# Patient Record
Sex: Female | Born: 1985 | Race: White | Hispanic: No | Marital: Married | Smoking: Never smoker
Health system: Southern US, Community
[De-identification: ages and names within clinical notes are randomized; demographics above are authoritative.]

## PROBLEM LIST (undated history)

## (undated) DIAGNOSIS — Z789 Other specified health status: Secondary | ICD-10-CM

## (undated) DIAGNOSIS — R519 Headache, unspecified: Secondary | ICD-10-CM

## (undated) DIAGNOSIS — R51 Headache: Secondary | ICD-10-CM

## (undated) DIAGNOSIS — Z8742 Personal history of other diseases of the female genital tract: Secondary | ICD-10-CM

## (undated) HISTORY — PX: UMBILICAL HERNIA REPAIR: SHX196

## (undated) HISTORY — DX: Headache, unspecified: R51.9

## (undated) HISTORY — DX: Personal history of other diseases of the female genital tract: Z87.42

## (undated) HISTORY — DX: Headache: R51

---

## 2010-02-22 ENCOUNTER — Encounter: Payer: Self-pay | Admitting: Oncology

## 2010-03-22 ENCOUNTER — Encounter: Payer: Self-pay | Admitting: Genetic Counselor

## 2010-03-29 ENCOUNTER — Encounter: Payer: BC Managed Care – PPO | Admitting: Genetic Counselor

## 2010-04-26 ENCOUNTER — Other Ambulatory Visit: Payer: Self-pay | Admitting: Oncology

## 2010-04-26 ENCOUNTER — Encounter (HOSPITAL_BASED_OUTPATIENT_CLINIC_OR_DEPARTMENT_OTHER): Payer: BC Managed Care – PPO | Admitting: Oncology

## 2010-04-26 DIAGNOSIS — Z1239 Encounter for other screening for malignant neoplasm of breast: Secondary | ICD-10-CM

## 2010-04-26 DIAGNOSIS — Z803 Family history of malignant neoplasm of breast: Secondary | ICD-10-CM

## 2010-04-26 DIAGNOSIS — Z79899 Other long term (current) drug therapy: Secondary | ICD-10-CM

## 2010-05-16 ENCOUNTER — Ambulatory Visit
Admission: RE | Admit: 2010-05-16 | Discharge: 2010-05-16 | Disposition: A | Discharge status: Home / Self Care | Payer: BC Managed Care – PPO | Source: Ambulatory Visit | Attending: Oncology | Admitting: Oncology

## 2010-05-16 DIAGNOSIS — Z803 Family history of malignant neoplasm of breast: Secondary | ICD-10-CM

## 2010-05-17 ENCOUNTER — Other Ambulatory Visit (HOSPITAL_COMMUNITY): Payer: BC Managed Care – PPO

## 2010-12-23 ENCOUNTER — Ambulatory Visit: Payer: BC Managed Care – PPO | Admitting: Oncology

## 2011-05-02 ENCOUNTER — Other Ambulatory Visit: Payer: Self-pay | Admitting: Obstetrics and Gynecology

## 2011-05-02 DIAGNOSIS — Z1231 Encounter for screening mammogram for malignant neoplasm of breast: Secondary | ICD-10-CM

## 2011-05-23 ENCOUNTER — Ambulatory Visit
Admission: RE | Admit: 2011-05-23 | Discharge: 2011-05-23 | Disposition: A | Discharge status: Home / Self Care | Payer: BC Managed Care – PPO | Source: Ambulatory Visit | Attending: Obstetrics and Gynecology | Admitting: Obstetrics and Gynecology

## 2011-05-23 DIAGNOSIS — Z1231 Encounter for screening mammogram for malignant neoplasm of breast: Secondary | ICD-10-CM

## 2012-06-02 ENCOUNTER — Other Ambulatory Visit: Payer: Self-pay

## 2012-06-02 DIAGNOSIS — Z1231 Encounter for screening mammogram for malignant neoplasm of breast: Secondary | ICD-10-CM

## 2012-06-24 ENCOUNTER — Ambulatory Visit
Admission: RE | Admit: 2012-06-24 | Discharge: 2012-06-24 | Disposition: A | Discharge status: Home / Self Care | Payer: BC Managed Care – PPO | Source: Ambulatory Visit

## 2012-06-24 DIAGNOSIS — Z1231 Encounter for screening mammogram for malignant neoplasm of breast: Secondary | ICD-10-CM

## 2012-06-30 ENCOUNTER — Other Ambulatory Visit: Payer: Self-pay | Admitting: Obstetrics and Gynecology

## 2012-06-30 DIAGNOSIS — Z803 Family history of malignant neoplasm of breast: Secondary | ICD-10-CM

## 2012-07-30 ENCOUNTER — Other Ambulatory Visit: Payer: BC Managed Care – PPO

## 2013-01-06 NOTE — L&D Delivery Note (Signed)
Delivery Note  SVD viable female Apgars 9,9 over 2nd degree ML laceration.  Placenta delivered spontaneously intact with 3VC. Repair with 2-0 Chromic with good support and hemostasis noted and R/V exam confirms.  PH art was sent.  [place] cord blood was not done.  Mother and baby were doing well.  EBL 300cc  Candice Campavid Tylisa Alcivar, MD

## 2013-03-18 LAB — OB RESULTS CONSOLE ANTIBODY SCREEN: Antibody Screen: NEGATIVE

## 2013-03-18 LAB — OB RESULTS CONSOLE GC/CHLAMYDIA
CHLAMYDIA, DNA PROBE: NEGATIVE
Gonorrhea: NEGATIVE

## 2013-03-18 LAB — OB RESULTS CONSOLE ABO/RH: RH TYPE: POSITIVE

## 2013-03-18 LAB — OB RESULTS CONSOLE HEPATITIS B SURFACE ANTIGEN: HEP B S AG: NEGATIVE

## 2013-03-18 LAB — OB RESULTS CONSOLE RUBELLA ANTIBODY, IGM: Rubella: IMMUNE

## 2013-03-18 LAB — OB RESULTS CONSOLE RPR: RPR: NONREACTIVE

## 2013-03-18 LAB — OB RESULTS CONSOLE HIV ANTIBODY (ROUTINE TESTING): HIV: NONREACTIVE

## 2013-08-20 ENCOUNTER — Inpatient Hospital Stay (HOSPITAL_COMMUNITY)
Admission: AD | Admit: 2013-08-20 | Payer: BC Managed Care – PPO | Source: Ambulatory Visit | Admitting: Obstetrics and Gynecology

## 2013-09-23 LAB — OB RESULTS CONSOLE GBS: GBS: NEGATIVE

## 2013-10-27 ENCOUNTER — Inpatient Hospital Stay (HOSPITAL_COMMUNITY)
Admission: AD | Admit: 2013-10-27 | Discharge: 2013-10-29 | DRG: 775 | Disposition: A | Discharge status: Home / Self Care | Payer: BC Managed Care – PPO | Source: Ambulatory Visit | Attending: Obstetrics & Gynecology | Admitting: Obstetrics & Gynecology

## 2013-10-27 ENCOUNTER — Encounter (HOSPITAL_COMMUNITY): Payer: Self-pay | Admitting: *Deleted

## 2013-10-27 DIAGNOSIS — Z3490 Encounter for supervision of normal pregnancy, unspecified, unspecified trimester: Secondary | ICD-10-CM

## 2013-10-27 DIAGNOSIS — O471 False labor at or after 37 completed weeks of gestation: Secondary | ICD-10-CM | POA: Diagnosis present

## 2013-10-27 DIAGNOSIS — Z3A40 40 weeks gestation of pregnancy: Secondary | ICD-10-CM | POA: Diagnosis present

## 2013-10-27 HISTORY — DX: Other specified health status: Z78.9

## 2013-10-27 LAB — CBC
HEMATOCRIT: 35.1 % — AB (ref 36.0–46.0)
Hemoglobin: 12.1 g/dL (ref 12.0–15.0)
MCH: 31.2 pg (ref 26.0–34.0)
MCHC: 34.5 g/dL (ref 30.0–36.0)
MCV: 90.5 fL (ref 78.0–100.0)
Platelets: 159 10*3/uL (ref 150–400)
RBC: 3.88 MIL/uL (ref 3.87–5.11)
RDW: 13.1 % (ref 11.5–15.5)
WBC: 19.6 10*3/uL — ABNORMAL HIGH (ref 4.0–10.5)

## 2013-10-27 LAB — RPR

## 2013-10-27 MED ORDER — LACTATED RINGERS IV SOLN
INTRAVENOUS | Status: DC
  Administered 2013-10-27: 10:00:00 via INTRAVENOUS

## 2013-10-27 MED ORDER — OXYCODONE-ACETAMINOPHEN 5-325 MG PO TABS: 2.0000 | ORAL_TABLET | ORAL | Status: DC | PRN

## 2013-10-27 MED ORDER — OXYTOCIN 40 UNITS IN LACTATED RINGERS INFUSION - SIMPLE MED
62.5000 mL/h | INTRAVENOUS | Status: DC
  Administered 2013-10-27: 62.5 mL/h via INTRAVENOUS
  Filled 2013-10-27: qty 1000

## 2013-10-27 MED ORDER — EPHEDRINE 5 MG/ML INJ: 10.0000 mg | INTRAVENOUS | Status: DC | PRN

## 2013-10-27 MED ORDER — ACETAMINOPHEN 325 MG PO TABS: 650.0000 mg | ORAL_TABLET | ORAL | Status: DC | PRN

## 2013-10-27 MED ORDER — FENTANYL 2.5 MCG/ML BUPIVACAINE 1/10 % EPIDURAL INFUSION (WH - ANES): 14.0000 mL/h | INTRAMUSCULAR | Status: DC | PRN

## 2013-10-27 MED ORDER — PRENATAL MULTIVITAMIN CH
1.0000 | ORAL_TABLET | Freq: Every day | ORAL | Status: DC
  Administered 2013-10-28 – 2013-10-29 (×2): 1 via ORAL
  Filled 2013-10-27 (×2): qty 1

## 2013-10-27 MED ORDER — ONDANSETRON HCL 4 MG/2ML IJ SOLN: 4.0000 mg | INTRAMUSCULAR | Status: DC | PRN

## 2013-10-27 MED ORDER — OXYCODONE-ACETAMINOPHEN 5-325 MG PO TABS: 1.0000 | ORAL_TABLET | ORAL | Status: DC | PRN

## 2013-10-27 MED ORDER — MEDROXYPROGESTERONE ACETATE 150 MG/ML IM SUSP: 150.0000 mg | INTRAMUSCULAR | Status: DC | PRN

## 2013-10-27 MED ORDER — SIMETHICONE 80 MG PO CHEW
80.0000 mg | CHEWABLE_TABLET | ORAL | Status: DC | PRN
Start: 2013-10-27 — End: 2013-10-29

## 2013-10-27 MED ORDER — ONDANSETRON HCL 4 MG/2ML IJ SOLN: 4.0000 mg | Freq: Four times a day (QID) | INTRAMUSCULAR | Status: DC | PRN

## 2013-10-27 MED ORDER — PHENYLEPHRINE 40 MCG/ML (10ML) SYRINGE FOR IV PUSH (FOR BLOOD PRESSURE SUPPORT): 80.0000 ug | PREFILLED_SYRINGE | INTRAVENOUS | Status: DC | PRN

## 2013-10-27 MED ORDER — SENNOSIDES-DOCUSATE SODIUM 8.6-50 MG PO TABS
2.0000 | ORAL_TABLET | ORAL | Status: DC
  Administered 2013-10-27 – 2013-10-28 (×2): 2 via ORAL
  Filled 2013-10-27 (×2): qty 2

## 2013-10-27 MED ORDER — LANOLIN HYDROUS EX OINT: TOPICAL_OINTMENT | CUTANEOUS | Status: DC | PRN

## 2013-10-27 MED ORDER — LIDOCAINE HCL (PF) 1 % IJ SOLN
30.0000 mL | Freq: Once | INTRAMUSCULAR | Status: AC
  Administered 2013-10-27: 30 mL via INTRADERMAL

## 2013-10-27 MED ORDER — CITRIC ACID-SODIUM CITRATE 334-500 MG/5ML PO SOLN: 30.0000 mL | ORAL | Status: DC | PRN

## 2013-10-27 MED ORDER — DIPHENHYDRAMINE HCL 25 MG PO CAPS: 25.0000 mg | ORAL_CAPSULE | Freq: Four times a day (QID) | ORAL | Status: DC | PRN

## 2013-10-27 MED ORDER — OXYTOCIN BOLUS FROM INFUSION
500.0000 mL | INTRAVENOUS | Status: DC
  Administered 2013-10-27: 500 mL via INTRAVENOUS

## 2013-10-27 MED ORDER — LACTATED RINGERS IV SOLN: 500.0000 mL | Freq: Once | INTRAVENOUS | Status: DC

## 2013-10-27 MED ORDER — MEASLES, MUMPS & RUBELLA VAC ~~LOC~~ INJ
0.5000 mL | INJECTION | Freq: Once | SUBCUTANEOUS | Status: DC
  Filled 2013-10-27: qty 0.5

## 2013-10-27 MED ORDER — ONDANSETRON HCL 4 MG PO TABS: 4.0000 mg | ORAL_TABLET | ORAL | Status: DC | PRN

## 2013-10-27 MED ORDER — LACTATED RINGERS IV SOLN: 500.0000 mL | INTRAVENOUS | Status: DC | PRN

## 2013-10-27 MED ORDER — LIDOCAINE HCL (PF) 1 % IJ SOLN
30.0000 mL | INTRAMUSCULAR | Status: DC | PRN
Start: 2013-10-27 — End: 2013-10-27
  Filled 2013-10-27: qty 30

## 2013-10-27 MED ORDER — BENZOCAINE-MENTHOL 20-0.5 % EX AERO
1.0000 "application " | INHALATION_SPRAY | CUTANEOUS | Status: DC | PRN
  Administered 2013-10-27 – 2013-10-29 (×2): 1 via TOPICAL
  Filled 2013-10-27 (×3): qty 56

## 2013-10-27 MED ORDER — WITCH HAZEL-GLYCERIN EX PADS: 1.0000 "application " | MEDICATED_PAD | CUTANEOUS | Status: DC | PRN

## 2013-10-27 MED ORDER — DIPHENHYDRAMINE HCL 50 MG/ML IJ SOLN: 12.5000 mg | INTRAMUSCULAR | Status: DC | PRN

## 2013-10-27 MED ORDER — DIBUCAINE 1 % RE OINT
1.0000 "application " | TOPICAL_OINTMENT | RECTAL | Status: DC | PRN
  Filled 2013-10-27: qty 28

## 2013-10-27 MED ORDER — TETANUS-DIPHTH-ACELL PERTUSSIS 5-2.5-18.5 LF-MCG/0.5 IM SUSP: 0.5000 mL | Freq: Once | INTRAMUSCULAR | Status: DC

## 2013-10-27 MED ORDER — FLEET ENEMA 7-19 GM/118ML RE ENEM: 1.0000 | ENEMA | RECTAL | Status: DC | PRN

## 2013-10-27 MED ORDER — ZOLPIDEM TARTRATE 5 MG PO TABS: 5.0000 mg | ORAL_TABLET | Freq: Every evening | ORAL | Status: DC | PRN

## 2013-10-27 MED ORDER — IBUPROFEN 600 MG PO TABS
600.0000 mg | ORAL_TABLET | Freq: Four times a day (QID) | ORAL | Status: DC
  Administered 2013-10-27 – 2013-10-29 (×8): 600 mg via ORAL
  Filled 2013-10-27 (×9): qty 1

## 2013-10-27 NOTE — H&P (Signed)
Carmie KannerBrittany Houchen is a 28 y.o. female presenting for labor sxs.  Pregnancy uncomplcated.  GBS neg.Marland Kitchen. History OB History   Grav Para Term Preterm Abortions TAB SAB Ect Mult Living   1              Past Medical History  Diagnosis Date  . Medical history non-contributory    Past Surgical History  Procedure Laterality Date  . Umbilical hernia repair     Family History: family history is not on file. Social History:  reports that she has never smoked. She does not have any smokeless tobacco history on file. She reports that she does not drink alcohol or use illicit drugs.   Prenatal Transfer Tool  Maternal Diabetes: No Genetic Screening: Normal Maternal Ultrasounds/Referrals: Normal Fetal Ultrasounds or other Referrals:  None Maternal Substance Abuse:  No Significant Maternal Medications:  None Significant Maternal Lab Results:  None Other Comments:  None  ROS  Dilation: 7 Effacement (%): 90 Station: -2 Exam by:: L. Paschal, RN Blood pressure 124/67, pulse 82, temperature 98.4 F (36.9 C), temperature source Oral, resp. rate 18. Exam Physical Exam  Prenatal labs: ABO, Rh:   Antibody:   Rubella:   RPR:    HBsAg:    HIV:    GBS:     Assessment/Plan: IUP at term in active labor Anticipate SVD   Oron Westrup C 10/27/2013, 9:46 AM

## 2013-10-27 NOTE — Lactation Note (Signed)
This note was copied from the chart of Lori [place] Islands[place]ittany Hammes. Lactation Consultation Note  Patient Name: Lori Carmie KannerBrittany Roach Today's Date: 10/27/2013 Reason for consult: Initial assessment of this mom and baby, with husband at bedside, now 10 hours postpartum.  Mom is primipara and she has tried both cradle and football positions but [employer] assisted with across-the-lap position and baby latched well after several attempts and demonstrated rhythmical sucking bursts and swallows for >10 minutes.  FOB shown how to assist and stimulate baby, if sleepy.  LC encouraged cue feedings and frequent STS, vary positions if baby fussy in one position or if unable to latch.  Mom encouraged to feed baby 8-12 times/24 hours and with feeding cues. LC encouraged review of Baby and Me pp 9, 14 and 20-25 for STS and BF information. LC provided [employer] Resource brochure and reviewed [facility] services and list of community and web site resources.    Maternal Data Formula Feeding for Exclusion: No Has patient been taught Hand Expression?: Yes Does the patient have breastfeeding experience prior to this delivery?: No  Feeding Feeding Type: Breast Fed Length of feed: 10 min  LATCH Score/Interventions Latch: Grasps breast easily, tongue down, lips flanged, rhythmical sucking. (needed a few attempts but then sustained latch) Intervention(s): Adjust position;Assist with latch;Breast compression  Audible Swallowing: Spontaneous and intermittent Intervention(s): Hand expression;Alternate breast massage;Skin to skin  Type of Nipple: Everted at rest and after stimulation  Comfort (Breast/Nipple): Soft / non-tender     Hold (Positioning): Assistance needed to correctly position infant at breast and maintain latch. Intervention(s): Position options;Skin to skin;Support Pillows;Breastfeeding basics reviewed  LATCH Score: 9 (LC observed and assisted on right breast in cross-cradle position)  Lactation Tools Discussed/Used   STS,  cue feedings, hand expression  Consult Status Consult Status: Follow-up Date: 10/28/13 Follow-up type: In-patient    Warrick ParisianBryant, Calley Drenning [facility] 10/27/2013, 10:13 PM

## 2013-10-27 NOTE — MAU Note (Addendum)
Onset contractions 0230, good Fetal movement, plan NO epidural, open to this if needed, has been 4cm, induction scheduled next Thursday.GBS negative. Coping well with labor

## 2013-10-28 LAB — CBC
HCT: 32.9 % — ABNORMAL LOW (ref 36.0–46.0)
Hemoglobin: 11.2 g/dL — ABNORMAL LOW (ref 12.0–15.0)
MCH: 31.2 pg (ref 26.0–34.0)
MCHC: 34 g/dL (ref 30.0–36.0)
MCV: 91.6 fL (ref 78.0–100.0)
Platelets: 153 10*3/uL (ref 150–400)
RBC: 3.59 MIL/uL — ABNORMAL LOW (ref 3.87–5.11)
RDW: 13.3 % (ref 11.5–15.5)
WBC: 15 10*3/uL — ABNORMAL HIGH (ref 4.0–10.5)

## 2013-10-28 NOTE — Lactation Note (Signed)
This note was copied from the chart of Lori [place]. Lactation Consultation Note  Patient Name: Lori Carmie KannerBrittany Roach MVHQI'OToday's Date: 10/28/2013 Reason for consult: Follow-up assessment Baby 27 hours of life. Mom reports some nipple tenderness. Since baby just finished nursing and sound asleep, enc mom to call out for assistance with latching. Discussed need to maintain a deep latch. Mom states that she is able to hand express colostrum readily from both breast. Her nipples are round with no breakdown or abrasions from baby suckling. Enc mom to continue to feed with cues. Discussed cluster-feeding. Enc lots of STS.   Maternal Data    Feeding Feeding Type:  (Mom reports baby just finished nursing.) Length of feed: 30 min  LATCH Score/Interventions                      Lactation Tools Discussed/Used     Consult Status Consult Status: PRN    Geralynn OchsWILLIARD, Damilola Flamm 10/28/2013, 3:20 PM

## 2013-10-28 NOTE — Progress Notes (Signed)
Post Partum Day 1 Subjective: no complaints, up ad lib, voiding and tolerating PO.  Wants circ.  Objective: Blood pressure 107/50, pulse 69, temperature 98.5 F (36.9 C), temperature source Oral, resp. rate 18, height 5\' 3"  (1.6 m), weight 136 lb (61.689 kg), unknown if currently breastfeeding.  Physical Exam:  General: alert, cooperative and appears stated age Lochia: appropriate Uterine Fundus: firm Incision: healing well, no significant drainage DVT Evaluation: No evidence of DVT seen on physical exam. Negative Homan's sign. No cords or calf tenderness.   Recent Labs  10/27/13 0946 10/28/13 0605  HGB 12.1 11.2*  HCT 35.1* 32.9*    Assessment/Plan: Plan for discharge tomorrow and Circumcision prior to discharge Patient counseled for circ including risk of bleeding, infection, and scarring.  All questions were answered and we will proceed.     LOS: 1 day   Lori Roach 10/28/2013, 8:44 AM

## 2013-10-29 MED ORDER — IBUPROFEN 600 MG PO TABS: 600.0000 mg | ORAL_TABLET | Freq: Four times a day (QID) | ORAL | Status: DC

## 2013-10-29 MED ORDER — OXYCODONE-ACETAMINOPHEN 5-325 MG PO TABS: 1.0000 | ORAL_TABLET | ORAL | Status: DC | PRN

## 2013-10-29 NOTE — Discharge Summary (Signed)
Obstetric Discharge Summary Reason for Admission: onset of labor Prenatal Procedures: none Intrapartum Procedures: spontaneous vaginal delivery Postpartum Procedures: none Complications-Operative and Postpartum: none and 2nd degree perineal laceration Hemoglobin  Date Value Ref Range Status  10/28/2013 11.2* 12.0 - 15.0 g/dL Final     HCT  Date Value Ref Range Status  10/28/2013 32.9* 36.0 - 46.0 % Final    Physical Exam:  General: alert, cooperative and appears stated age Lochia: appropriate Uterine Fundus: firm Incision: healing well, no significant drainage, no dehiscence DVT Evaluation: No evidence of DVT seen on physical exam. Negative Homan's sign. No cords or calf tenderness.  Discharge Diagnoses: Term Pregnancy-delivered  Discharge Information: Date: 10/29/2013 Activity: pelvic rest Diet: routine Medications: PNV, Ibuprofen and Percocet Condition: stable Instructions: refer to practice specific booklet Discharge to: home   Newborn Data: Live born female  Birth Weight: 8 lb 0.9 oz (3655 g) APGAR: 9, 9  Home with mother.  Correy Weidner 10/29/2013, 9:23 AM

## 2013-10-29 NOTE — Progress Notes (Signed)
Post Partum Day 2 Subjective: no complaints, up ad lib, voiding and tolerating PO  Objective: Blood pressure 108/50, pulse 66, temperature 98 F (36.7 C), temperature source Oral, resp. rate 18, height 5\' 3"  (1.6 m), weight 136 lb (61.689 kg), SpO2 98.00%, unknown if currently breastfeeding.  Physical Exam:  General: alert, cooperative and appears stated age Lochia: appropriate Uterine Fundus: firm Incision: healing well, no significant drainage, no dehiscence DVT Evaluation: No evidence of DVT seen on physical exam. Negative Homan's sign. No cords or calf tenderness.   Recent Labs  10/27/13 0946 10/28/13 0605  HGB 12.1 11.2*  HCT 35.1* 32.9*    Assessment/Plan: Discharge home, Breastfeeding and Circumcision prior to discharge   LOS: 2 days   Lori Roach 10/29/2013, 9:19 AM

## 2013-10-29 NOTE — Discharge Instructions (Signed)
Call MD for T>100.4, heavy vaginal bleeding, or respiratory distress.  Call office to schedule postpartum visit in 4-6 weeks.  No driving while taking narcotics.  Pelvic rest x 6 weeks.

## 2013-11-03 ENCOUNTER — Inpatient Hospital Stay (HOSPITAL_COMMUNITY): Admission: RE | Admit: 2013-11-03 | Payer: BC Managed Care – PPO | Source: Ambulatory Visit

## 2013-11-07 ENCOUNTER — Encounter (HOSPITAL_COMMUNITY): Payer: Self-pay | Admitting: *Deleted

## 2016-01-07 NOTE — L&D Delivery Note (Signed)
Delivery Note At 4:23 AM a viable female was delivered via Vaginal, Spontaneous  APGAR:9 9  Weight pending  .   Placenta status:sponanteously with 3 vessel cord , .  Cord:  with the following complications:none.  Cord pH: not obtained  Anesthesia:  none Episiotomy: None Lacerations: 1st degree Suture Repair: 3.0 chromic Est. Blood Loss (mL): 300  Mom to postpartum.  Baby to Couplet care / Skin to Skin.  Robecca Fulgham L 12/17/2016, 4:39 AM

## 2016-05-05 LAB — OB RESULTS CONSOLE HEPATITIS B SURFACE ANTIGEN: HEP B S AG: NEGATIVE

## 2016-05-05 LAB — OB RESULTS CONSOLE RUBELLA ANTIBODY, IGM: Rubella: IMMUNE

## 2016-05-05 LAB — OB RESULTS CONSOLE ABO/RH: RH Type: POSITIVE

## 2016-05-05 LAB — OB RESULTS CONSOLE HIV ANTIBODY (ROUTINE TESTING): HIV: NONREACTIVE

## 2016-05-05 LAB — OB RESULTS CONSOLE GC/CHLAMYDIA
Chlamydia: NEGATIVE
Gonorrhea: NEGATIVE

## 2016-05-05 LAB — OB RESULTS CONSOLE ANTIBODY SCREEN: ANTIBODY SCREEN: NEGATIVE

## 2016-05-05 LAB — OB RESULTS CONSOLE RPR: RPR: NONREACTIVE

## 2016-12-12 ENCOUNTER — Encounter (HOSPITAL_COMMUNITY): Payer: Self-pay | Admitting: *Deleted

## 2016-12-12 ENCOUNTER — Telehealth (HOSPITAL_COMMUNITY): Payer: Self-pay | Admitting: *Deleted

## 2016-12-12 LAB — OB RESULTS CONSOLE GBS: GBS: NEGATIVE

## 2016-12-12 NOTE — Telephone Encounter (Signed)
Preadmission screen  

## 2016-12-16 ENCOUNTER — Inpatient Hospital Stay (HOSPITAL_COMMUNITY): Payer: BLUE CROSS/BLUE SHIELD

## 2016-12-17 ENCOUNTER — Encounter (HOSPITAL_COMMUNITY): Payer: Self-pay

## 2016-12-17 ENCOUNTER — Inpatient Hospital Stay (HOSPITAL_COMMUNITY)
Admission: AD | Admit: 2016-12-17 | Discharge: 2016-12-18 | DRG: 807 | Disposition: A | Discharge status: Home / Self Care | Payer: BLUE CROSS/BLUE SHIELD | Source: Ambulatory Visit | Attending: Obstetrics and Gynecology | Admitting: Obstetrics and Gynecology

## 2016-12-17 DIAGNOSIS — Z3A40 40 weeks gestation of pregnancy: Secondary | ICD-10-CM | POA: Diagnosis not present

## 2016-12-17 DIAGNOSIS — Z3483 Encounter for supervision of other normal pregnancy, third trimester: Secondary | ICD-10-CM | POA: Diagnosis present

## 2016-12-17 LAB — CBC
HEMATOCRIT: 38.1 % (ref 36.0–46.0)
Hemoglobin: 12.8 g/dL (ref 12.0–15.0)
MCH: 31 pg (ref 26.0–34.0)
MCHC: 33.6 g/dL (ref 30.0–36.0)
MCV: 92.3 fL (ref 78.0–100.0)
PLATELETS: 158 10*3/uL (ref 150–400)
RBC: 4.13 MIL/uL (ref 3.87–5.11)
RDW: 12.8 % (ref 11.5–15.5)
WBC: 12.9 10*3/uL — AB (ref 4.0–10.5)

## 2016-12-17 LAB — TYPE AND SCREEN
ABO/RH(D): A POS
Antibody Screen: NEGATIVE

## 2016-12-17 LAB — RPR: RPR Ser Ql: NONREACTIVE

## 2016-12-17 LAB — ABO/RH: ABO/RH(D): A POS

## 2016-12-17 MED ORDER — DIBUCAINE 1 % RE OINT: 1.0000 "application " | TOPICAL_OINTMENT | RECTAL | Status: DC | PRN

## 2016-12-17 MED ORDER — TETANUS-DIPHTH-ACELL PERTUSSIS 5-2.5-18.5 LF-MCG/0.5 IM SUSP: 0.5000 mL | Freq: Once | INTRAMUSCULAR | Status: DC

## 2016-12-17 MED ORDER — DIPHENHYDRAMINE HCL 25 MG PO CAPS: 25.0000 mg | ORAL_CAPSULE | Freq: Four times a day (QID) | ORAL | Status: DC | PRN

## 2016-12-17 MED ORDER — ONDANSETRON HCL 4 MG/2ML IJ SOLN: 4.0000 mg | Freq: Four times a day (QID) | INTRAMUSCULAR | Status: DC | PRN

## 2016-12-17 MED ORDER — FLEET ENEMA 7-19 GM/118ML RE ENEM: 1.0000 | ENEMA | RECTAL | Status: DC | PRN

## 2016-12-17 MED ORDER — MEDROXYPROGESTERONE ACETATE 150 MG/ML IM SUSP: 150.0000 mg | INTRAMUSCULAR | Status: DC | PRN

## 2016-12-17 MED ORDER — ZOLPIDEM TARTRATE 5 MG PO TABS: 5.0000 mg | ORAL_TABLET | Freq: Every evening | ORAL | Status: DC | PRN

## 2016-12-17 MED ORDER — BISACODYL 10 MG RE SUPP: 10.0000 mg | Freq: Every day | RECTAL | Status: DC | PRN

## 2016-12-17 MED ORDER — ACETAMINOPHEN 325 MG PO TABS: 650.0000 mg | ORAL_TABLET | ORAL | Status: DC | PRN

## 2016-12-17 MED ORDER — OXYCODONE-ACETAMINOPHEN 5-325 MG PO TABS: 1.0000 | ORAL_TABLET | ORAL | Status: DC | PRN

## 2016-12-17 MED ORDER — IBUPROFEN 600 MG PO TABS
600.0000 mg | ORAL_TABLET | Freq: Four times a day (QID) | ORAL | Status: DC
  Administered 2016-12-17 – 2016-12-18 (×6): 600 mg via ORAL
  Filled 2016-12-17 (×6): qty 1

## 2016-12-17 MED ORDER — LACTATED RINGERS IV SOLN
INTRAVENOUS | Status: DC
  Administered 2016-12-17: 04:00:00 via INTRAVENOUS

## 2016-12-17 MED ORDER — ONDANSETRON HCL 4 MG/2ML IJ SOLN: 4.0000 mg | INTRAMUSCULAR | Status: DC | PRN

## 2016-12-17 MED ORDER — OXYCODONE-ACETAMINOPHEN 5-325 MG PO TABS: 2.0000 | ORAL_TABLET | ORAL | Status: DC | PRN

## 2016-12-17 MED ORDER — FLEET ENEMA 7-19 GM/118ML RE ENEM: 1.0000 | ENEMA | Freq: Every day | RECTAL | Status: DC | PRN

## 2016-12-17 MED ORDER — OXYTOCIN 40 UNITS IN LACTATED RINGERS INFUSION - SIMPLE MED
INTRAVENOUS | Status: AC
  Filled 2016-12-17: qty 1000

## 2016-12-17 MED ORDER — WITCH HAZEL-GLYCERIN EX PADS: 1.0000 "application " | MEDICATED_PAD | CUTANEOUS | Status: DC | PRN

## 2016-12-17 MED ORDER — SENNOSIDES-DOCUSATE SODIUM 8.6-50 MG PO TABS
2.0000 | ORAL_TABLET | ORAL | Status: DC
  Administered 2016-12-17: 2 via ORAL
  Filled 2016-12-17: qty 2

## 2016-12-17 MED ORDER — BENZOCAINE-MENTHOL 20-0.5 % EX AERO
1.0000 "application " | INHALATION_SPRAY | CUTANEOUS | Status: DC | PRN
  Administered 2016-12-17: 1 via TOPICAL
  Filled 2016-12-17: qty 56

## 2016-12-17 MED ORDER — SOD CITRATE-CITRIC ACID 500-334 MG/5ML PO SOLN: 30.0000 mL | ORAL | Status: DC | PRN

## 2016-12-17 MED ORDER — ONDANSETRON HCL 4 MG PO TABS: 4.0000 mg | ORAL_TABLET | ORAL | Status: DC | PRN

## 2016-12-17 MED ORDER — MEASLES, MUMPS & RUBELLA VAC ~~LOC~~ INJ
0.5000 mL | INJECTION | Freq: Once | SUBCUTANEOUS | Status: DC
  Filled 2016-12-17: qty 0.5

## 2016-12-17 MED ORDER — PRENATAL MULTIVITAMIN CH
1.0000 | ORAL_TABLET | Freq: Every day | ORAL | Status: DC
  Administered 2016-12-17 – 2016-12-18 (×2): 1 via ORAL
  Filled 2016-12-17 (×2): qty 1

## 2016-12-17 MED ORDER — COCONUT OIL OIL: 1.0000 "application " | TOPICAL_OIL | Status: DC | PRN

## 2016-12-17 MED ORDER — LIDOCAINE HCL (PF) 1 % IJ SOLN
INTRAMUSCULAR | Status: AC
  Filled 2016-12-17: qty 30

## 2016-12-17 MED ORDER — LIDOCAINE HCL (PF) 1 % IJ SOLN
30.0000 mL | INTRAMUSCULAR | Status: DC | PRN
  Administered 2016-12-17: 30 mL via SUBCUTANEOUS
  Filled 2016-12-17: qty 30

## 2016-12-17 MED ORDER — OXYTOCIN 40 UNITS IN LACTATED RINGERS INFUSION - SIMPLE MED
2.5000 [IU]/h | INTRAVENOUS | Status: DC
  Administered 2016-12-17: 2.5 [IU]/h via INTRAVENOUS

## 2016-12-17 MED ORDER — OXYTOCIN BOLUS FROM INFUSION
500.0000 mL | Freq: Once | INTRAVENOUS | Status: AC
  Administered 2016-12-17: 500 mL via INTRAVENOUS

## 2016-12-17 MED ORDER — LACTATED RINGERS IV SOLN: 500.0000 mL | INTRAVENOUS | Status: DC | PRN

## 2016-12-17 MED ORDER — SIMETHICONE 80 MG PO CHEW: 80.0000 mg | CHEWABLE_TABLET | ORAL | Status: DC | PRN

## 2016-12-17 NOTE — MAU Note (Signed)
Pt here with contractions; denies bleeding or leaking.

## 2016-12-17 NOTE — Progress Notes (Addendum)
Post Partum Day 0 Subjective: no complaints, up ad lib, voiding, tolerating PO and + flatus  Objective: Blood pressure (!) 104/58, pulse 68, temperature 98.7 F (37.1 C), temperature source Oral, resp. rate 16, height 5\' 3"  (1.6 m), weight 61.2 kg (135 lb), last menstrual period 03/09/2016, unknown if currently breastfeeding.  Physical Exam:  General: alert, cooperative and appears stated age Lochia: appropriate Uterine Fundus: firm Incision: N/A DVT Evaluation: No evidence of DVT seen on physical exam. Negative Homan's sign. No cords or calf tenderness. No significant calf/ankle edema.  Recent Labs    12/17/16 0406  HGB 12.8  HCT 38.1    Assessment/Plan: Plan for discharge tomorrow, Breastfeeding and Circumcision prior to discharge. Risks discussed including infection, bleeding, damage to penis and poor cosmesis.    LOS: 0 days   Lori Roach 12/17/2016, 8:13 AM

## 2016-12-17 NOTE — Progress Notes (Signed)
Patient ID: Lori KannerBrittany Roach, female   DOB: 1985-08-22, 31 y.o.   MRN: [id number]  Called to standby for delivery of pt who arrived to MAU at 9cm. Upon my arrival the pt was bearing down involuntarily. She had SROM of clear fluid and then began crowning shortly afterwards. She progressed to SVD @ 0423 of VMI, Apgars 8/9, Wt 8+8.9. Infant dried and placed on pt's abd. Dr Vincente PoliGrewal just subsequent to delivery.  Cam HaiSHAW, KIMBERLY 12/17/2016

## 2016-12-17 NOTE — Progress Notes (Signed)
Orders received for admission.

## 2016-12-17 NOTE — H&P (Signed)
Lori Roach is a 31 y.o. G 2 P 1 at 40 w 3 days presents complete and BBOW. OB History    Gravida Para Term Preterm AB Living   2 1 1  0 0 1   SAB TAB Ectopic Multiple Live Births   0 0 0 0 1     Past Medical History:  Diagnosis Date  . Headache   . History of PCOS   . Medical history non-contributory    Past Surgical History:  Procedure Laterality Date  . UMBILICAL HERNIA REPAIR     Family History: family history includes Breast cancer in her maternal grandmother and mother; Heart disease in her maternal grandfather; Hypertension in her paternal aunt and paternal grandmother. Social History:  reports that  has never smoked. she has never used smokeless tobacco. She reports that she does not drink alcohol or use drugs.     Maternal Diabetes: No Genetic Screening: Normal Maternal Ultrasounds/Referrals: Normal Fetal Ultrasounds or other Referrals:  None Maternal Substance Abuse:  No Significant Maternal Medications:  None Significant Maternal Lab Results:  None Other Comments:  None  Review of Systems  All other systems reviewed and are negative.  Maternal Medical History:  Reason for admission: Contractions.   Prenatal complications: no prenatal complications   Dilation: Lip/rim Effacement (%): 90 Station: 0 Exam by:: Charlene BrookeErin Davis, RNC  Blood pressure 111/64, pulse 90, temperature 98.2 F (36.8 C), temperature source Oral, resp. rate 18, height 5\' 3"  (1.6 m), weight 61.2 kg (135 lb), last menstrual period 03/09/2016, unknown if currently breastfeeding. Maternal Exam:  Abdomen: Fetal presentation: vertex     Fetal Exam Fetal State Assessment: Category I - tracings are normal.     Physical Exam  Nursing note and vitals reviewed. Constitutional: She appears well-developed.  HENT:  Head: Normocephalic.  Eyes: Pupils are equal, round, and reactive to light.  Neck: Normal range of motion.  Cardiovascular: Normal rate and regular rhythm.  Respiratory:  Effort normal.  GI: Soft.    Prenatal labs: ABO, Rh: A/Positive/-- (04/30 0000) Antibody: Negative (04/30 0000) Rubella: Immune (04/30 0000) RPR: Nonreactive (04/30 0000)  HBsAg: Negative (04/30 0000)  HIV: Non-reactive (04/30 0000)  GBS: Negative (12/07 0000)   Assessment/Plan: IUP at 40 w 3 days Labor NSVD  [name] 12/17/2016, 4:35 AM

## 2016-12-17 NOTE — Lactation Note (Signed)
This note was copied from a baby's chart. Lactation Consultation Note  Patient Name: Boy Carmie KannerBrittany Mercado JXBJY'NToday's Date: 12/17/2016 Reason for consult: Initial assessment   P2, Baby 10 hours old. Mother breastfed first child for 13 months without any problems.  Hx of PCOS. Mother denies questions or problems.  She states she has hand expressed drops. During consult, visitors entered room. Mom made aware of O/P services, breastfeeding support groups, community resources, and our phone # for post-discharge questions.  Suggest calling if mother needs further assistance.   Maternal Data Has patient been taught Hand Expression?: Yes Does the patient have breastfeeding experience prior to this delivery?: Yes  Feeding Feeding Type: Breast Fed Length of feed: 25 min  LATCH Score                   Interventions    Lactation Tools Discussed/Used     Consult Status Consult Status: Follow-up Date: 12/18/16 Follow-up type: In-patient    Dahlia ByesBerkelhammer, Ruth [facility] 12/17/2016, 2:47 PM

## 2016-12-18 LAB — BIRTH TISSUE RECOVERY COLLECTION (PLACENTA DONATION)

## 2016-12-18 MED ORDER — IBUPROFEN 600 MG PO TABS: 600.0000 mg | ORAL_TABLET | Freq: Four times a day (QID) | ORAL | 0 refills | Status: AC

## 2016-12-18 NOTE — Discharge Summary (Signed)
Obstetric Discharge Summary Reason for Admission: onset of labor Prenatal Procedures: ultrasound Intrapartum Procedures: spontaneous vaginal delivery Postpartum Procedures: none Complications-Operative and Postpartum: none Hemoglobin  Date Value Ref Range Status  12/17/2016 12.8 12.0 - 15.0 g/dL Final   HCT  Date Value Ref Range Status  12/17/2016 38.1 36.0 - 46.0 % Final    Physical Exam:  General: alert and cooperative Lochia: appropriate Uterine Fundus: firm Incision: n/a DVT Evaluation: No evidence of DVT seen on physical exam.  Discharge Diagnoses: Term Pregnancy-delivered  Discharge Information: Date: 12/18/2016 Activity: pelvic rest Diet: routine Medications: PNV and Ibuprofen Condition: stable Instructions: refer to practice specific booklet Discharge to: home Follow-up Information    [place] Schedule an appointment as soon as possible for a visit in 6 week(s).   Contact information: [address] [place] [address] [phone]           Newborn Data: Live born female  Birth Weight: 8 lb 8.9 oz (3881 g) APGAR: 8, 9  Newborn Delivery   Birth date/time:  12/17/2016 04:23:00 Delivery type:  Vaginal, Spontaneous     Home with mother.  Lori Roach 12/18/2016, 8:27 AM

## 2016-12-18 NOTE — Lactation Note (Signed)
This note was copied from a baby's chart. Lactation Consultation Note  Patient Name: Lori Roach GNFAO'ZToday's Date: 12/18/2016 Reason for consult: Follow-up assessment   P2, Ex BF.  Reviewed tips with mother regarding achieving a deeper latch. Encouraged STS, tummy time when awake and chin tug. Reviewed waking techniques. Mom encouraged to feed baby 8-12 times/24 hours and with feeding cues.  Reviewed engorgement care and monitoring voids/stools.    Maternal Data    Feeding Feeding Type: Breast Fed Length of feed: 15 min  LATCH Score Latch: Grasps breast easily, tongue down, lips flanged, rhythmical sucking.  Audible Swallowing: A few with stimulation  Type of Nipple: Everted at rest and after stimulation  Comfort (Breast/Nipple): Soft / non-tender  Hold (Positioning): No assistance needed to correctly position infant at breast.  LATCH Score: 9  Interventions    Lactation Tools Discussed/Used     Consult Status Consult Status: Complete    Hardie PulleyBerkelhammer, Ruth Boschen 12/18/2016, 9:44 AM

## 2016-12-19 ENCOUNTER — Inpatient Hospital Stay (HOSPITAL_COMMUNITY): Payer: BLUE CROSS/BLUE SHIELD

## 2018-06-03 ENCOUNTER — Other Ambulatory Visit: Payer: Self-pay | Admitting: Obstetrics and Gynecology

## 2018-06-03 DIAGNOSIS — Z803 Family history of malignant neoplasm of breast: Secondary | ICD-10-CM

## 2018-06-03 DIAGNOSIS — Z1231 Encounter for screening mammogram for malignant neoplasm of breast: Secondary | ICD-10-CM

## 2018-07-21 ENCOUNTER — Ambulatory Visit
Admission: RE | Admit: 2018-07-21 | Discharge: 2018-07-21 | Disposition: A | Discharge status: Home / Self Care | Payer: BC Managed Care – PPO | Source: Ambulatory Visit | Attending: Obstetrics and Gynecology | Admitting: Obstetrics and Gynecology

## 2018-07-21 ENCOUNTER — Other Ambulatory Visit: Payer: Self-pay

## 2018-07-21 DIAGNOSIS — Z1231 Encounter for screening mammogram for malignant neoplasm of breast: Secondary | ICD-10-CM

## 2018-08-19 ENCOUNTER — Ambulatory Visit
Admission: RE | Admit: 2018-08-19 | Discharge: 2018-08-19 | Disposition: A | Discharge status: Home / Self Care | Payer: BC Managed Care – PPO | Source: Ambulatory Visit | Attending: Obstetrics and Gynecology | Admitting: Obstetrics and Gynecology

## 2018-08-19 ENCOUNTER — Other Ambulatory Visit: Payer: Self-pay

## 2018-08-19 DIAGNOSIS — Z803 Family history of malignant neoplasm of breast: Secondary | ICD-10-CM

## 2018-08-19 MED ORDER — GADOBUTROL 1 MMOL/ML IV SOLN
6.0000 mL | Freq: Once | INTRAVENOUS | Status: AC | PRN
  Administered 2018-08-19: 09:00:00 6 mL via INTRAVENOUS

## 2019-08-25 ENCOUNTER — Encounter: Payer: Self-pay | Admitting: Genetic Counselor

## 2020-02-22 IMAGING — MR MR BILATERAL BREAST WITHOUT AND WITH CONTRAST
8 of 12 series · 33 of 48 positions shown · IV contrast (gadavist)
Comparison: No previous breast MRI.

CLINICAL DATA: Family history of breast cancer including mother at
age 32. Extremely dense breast.

LABS:  Not applicable
EXAM:
BILATERAL BREAST MRI WITH AND WITHOUT CONTRAST
TECHNIQUE: Multiplanar, multisequence MR images of both breasts were obtained
prior to and following the intravenous administration of 6 ml of
Gadavist

[Series 2: t2_tirm_tra ipat (a-p) · axial · 3.0mm · 0.62mm/px · 1 of 50 slices shown]
[im 1/50]
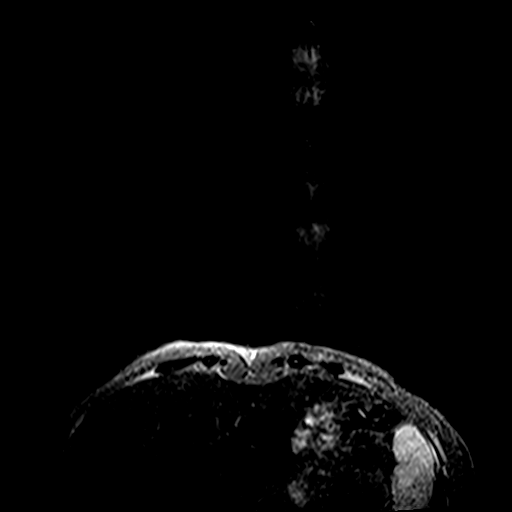

[Series 3: fl3d pre-cm no · axial · non-contrast · 1.2mm · 0.81mm/px · z∈[-67,+104]mm · 5 of 144 slices shown]
[im 1/144]
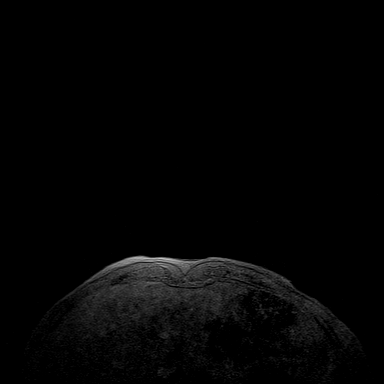
[im 36/144]
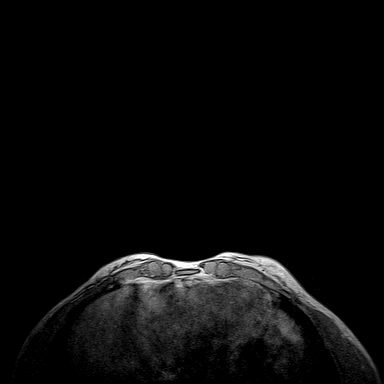
[im 72/144]
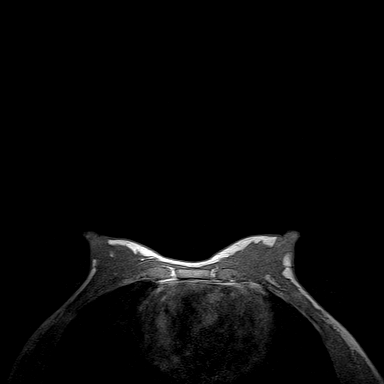
[im 108/144]
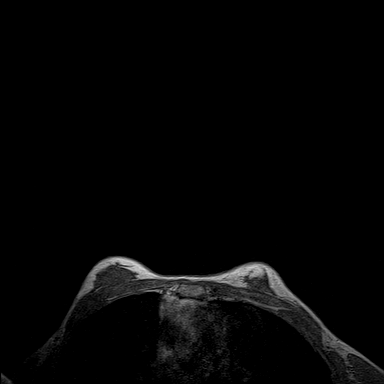
[im 144/144]
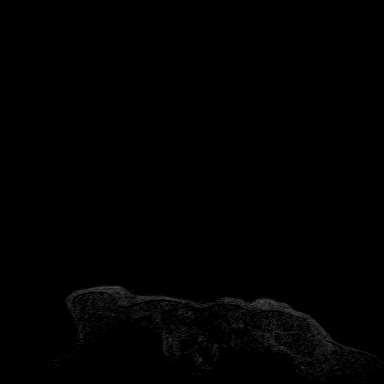

[Series 4: fl3d pre-cm · axial · non-contrast · 1.2mm · 0.83mm/px · z∈[-67,+105]mm · 5 of 144 slices shown]
[im 1/144]
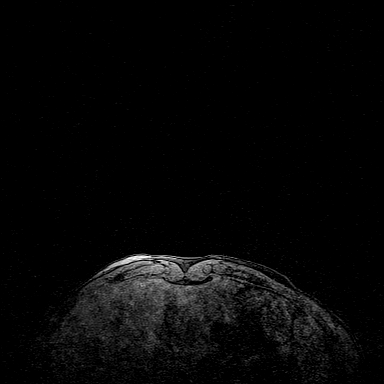
[im 36/144]
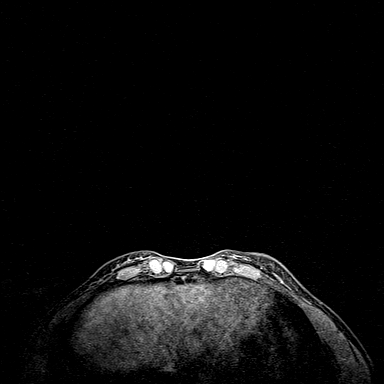
[im 72/144]
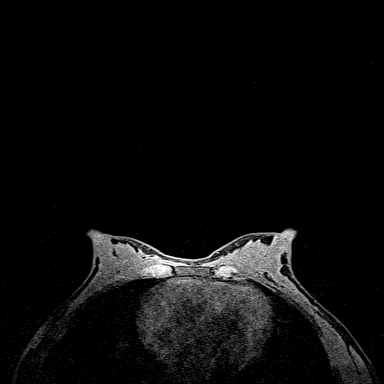
[im 108/144]
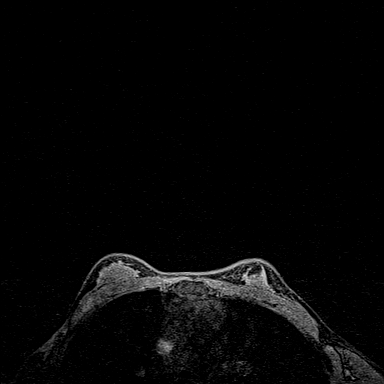
[im 144/144]
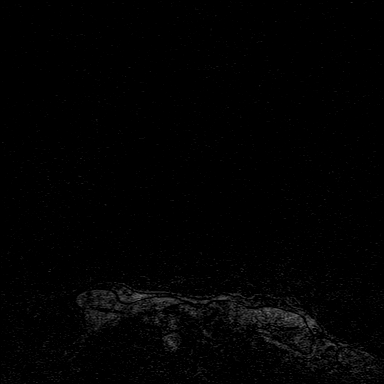

[Series 5: fl3d post-cm 20 · axial · 1.2mm · 0.83mm/px · z∈[-67,+105]mm · 5 of 144 slices shown (1 of 3)]
[im 1/144]
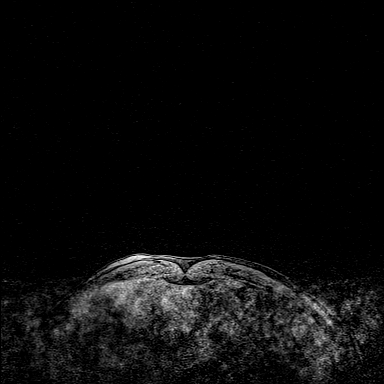
[im 36/144]
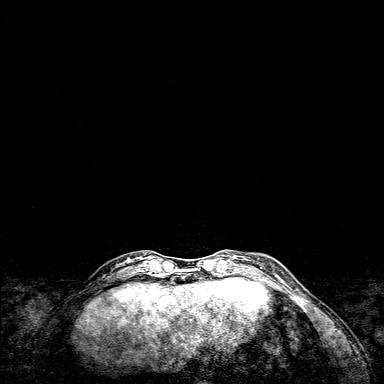
[im 72/144]
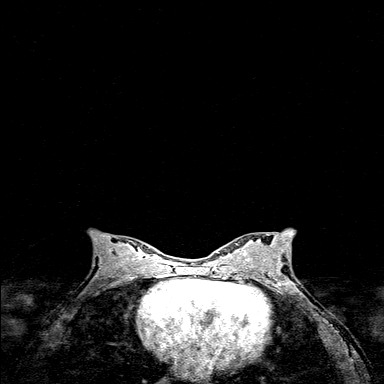
[im 108/144]
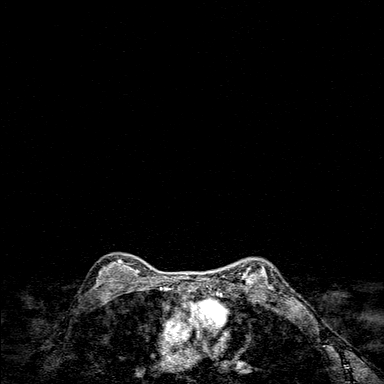
[im 144/144]
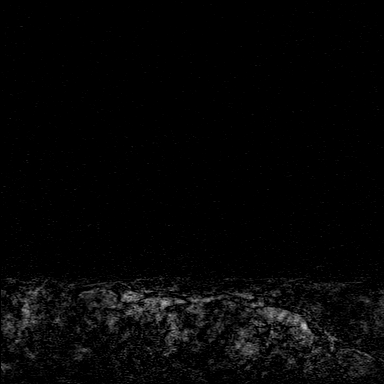

[Series 6: fl3d post-cm 20 · axial · 1.2mm · 0.83mm/px · z∈[-67,+105]mm · 5 of 144 slices shown (2 of 3)]
[im 1/144]
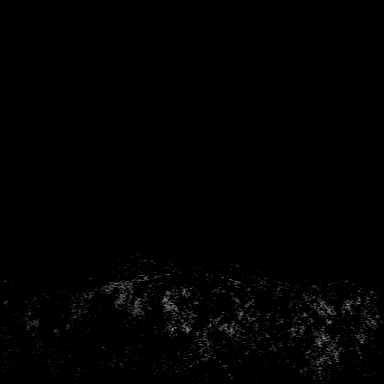
[im 36/144]
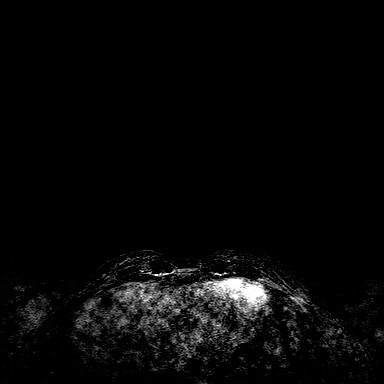
[im 72/144]
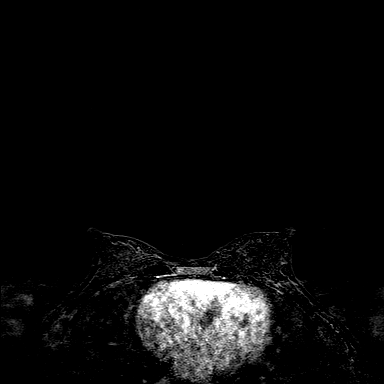
[im 108/144]
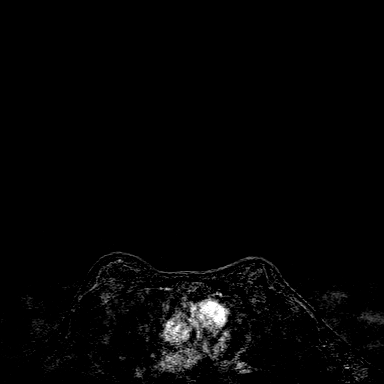
[im 144/144]
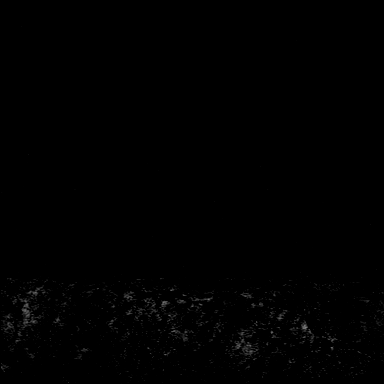

[Series 7: fl3d post-cm 20 · axial · 172.8mm · 0.83mm/px · 1 of 1 slices shown (3 of 3)]
[im 1/1]
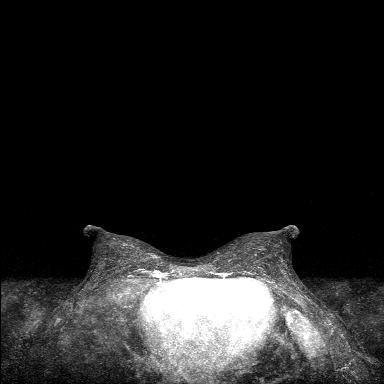

[Series 8: fl3d post-cm 3min · axial · 1.2mm · 0.83mm/px · z∈[-67,+105]mm · 6 of 144 slices shown]
[im 1/144]
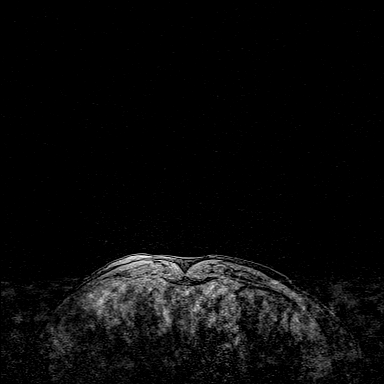
[im 29/144]
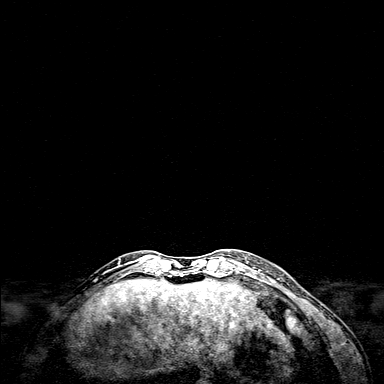
[im 58/144]
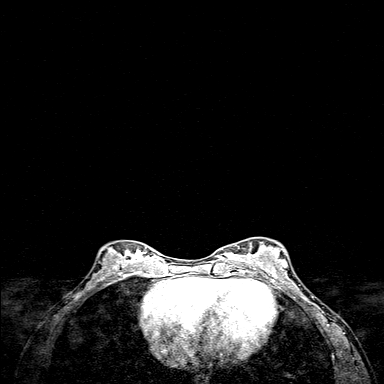
[im 86/144]
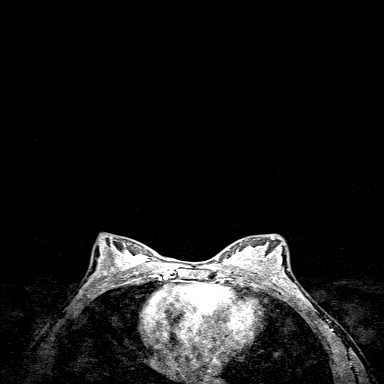
[im 115/144]
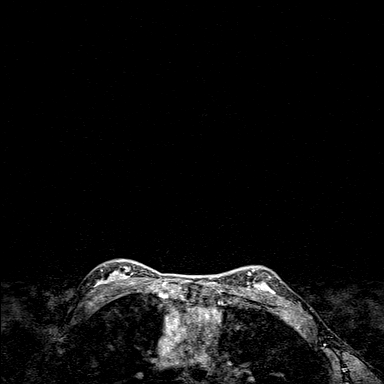
[im 144/144]
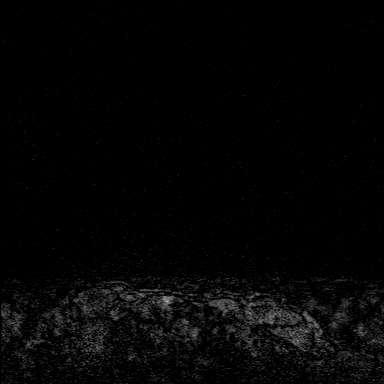

[Series 9: fl3d post-cm 3min_sub · axial · 1.2mm · 0.83mm/px · z∈[-67,+70]mm · 5 of 144 slices shown]
[im 1/144]
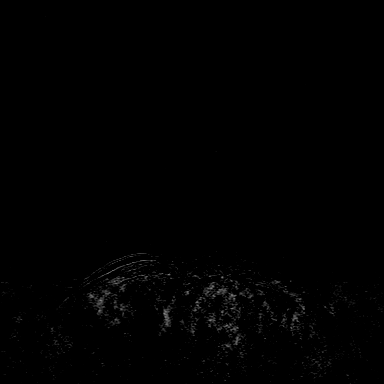
[im 29/144]
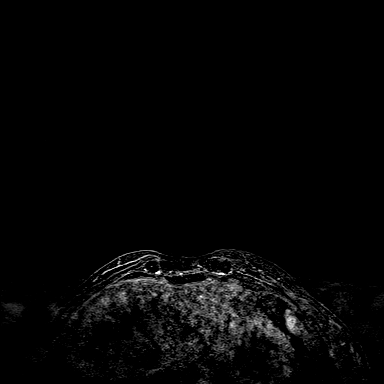
[im 58/144]
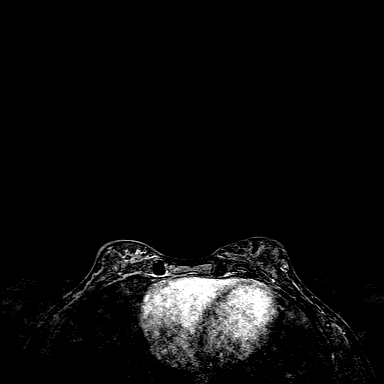
[im 86/144]
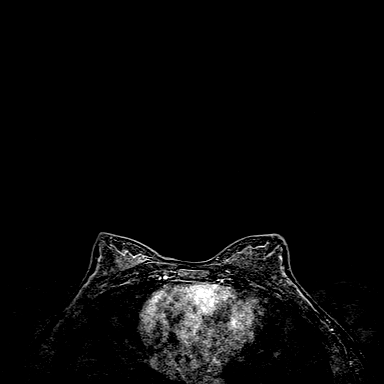
[im 115/144]
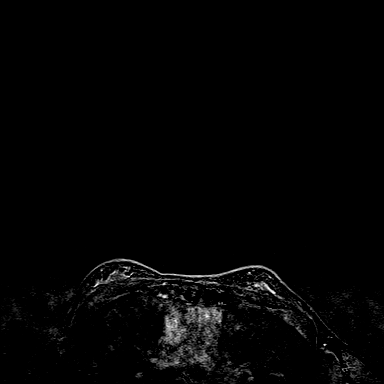

[33 of 48 positions shown; findings below may reference images not displayed]

Three-dimensional MR images were rendered by post-processing of the
original MR data on an independent workstation. The
three-dimensional MR images were interpreted, and findings are
reported in the following complete MRI report for this study. Three
dimensional images were evaluated at the independent [employer]
workstation
FINDINGS: Breast composition: d. Extreme fibroglandular tissue.

Background parenchymal enhancement: Mild.

Right breast: No suspicious enhancing mass, non-mass enhancement or
secondary signs of malignancy.

Left breast: No suspicious enhancing mass, non-mass enhancement or
secondary signs of malignancy.

Lymph nodes: No abnormal appearing lymph nodes.

Ancillary findings:  None.
IMPRESSION: No evidence of malignancy within either breast.

RECOMMENDATION:
1. Screening mammogram in one year.(Code:KC-Q-WTB). Given that
patient's mother was diagnosed with breast cancer at age 32, patient
should start now start a routine annual bilateral screening
mammogram schedule.
2. Given patient's family history of breast cancer, would consider
the addition of annual screening breast MRI to annual screening
mammography. Per American Cancer Society guidelines, annual
screening MRI of the breasts is recommended if a risk assessment
calculation for breast cancer, preferably using the Tyrer-Cuzick
model, measures greater than 20%.

BI-RADS CATEGORY  1: Negative.

## 2021-06-29 ENCOUNTER — Encounter (HOSPITAL_COMMUNITY): Payer: Self-pay

## 2021-06-29 ENCOUNTER — Inpatient Hospital Stay (HOSPITAL_COMMUNITY)
Admission: AD | Admit: 2021-06-29 | Discharge: 2021-06-30 | DRG: 807 | Disposition: A | Discharge status: Home / Self Care | Payer: BC Managed Care – PPO | Attending: Obstetrics and Gynecology | Admitting: Obstetrics and Gynecology

## 2021-06-29 ENCOUNTER — Other Ambulatory Visit: Payer: Self-pay

## 2021-06-29 DIAGNOSIS — Z3A39 39 weeks gestation of pregnancy: Secondary | ICD-10-CM

## 2021-06-29 DIAGNOSIS — Z3490 Encounter for supervision of normal pregnancy, unspecified, unspecified trimester: Secondary | ICD-10-CM

## 2021-06-29 DIAGNOSIS — Z880 Allergy status to penicillin: Secondary | ICD-10-CM

## 2021-06-29 DIAGNOSIS — O26893 Other specified pregnancy related conditions, third trimester: Secondary | ICD-10-CM | POA: Diagnosis present

## 2021-06-29 LAB — CBC
HCT: 34.8 % — ABNORMAL LOW (ref 36.0–46.0)
Hemoglobin: 11.9 g/dL — ABNORMAL LOW (ref 12.0–15.0)
MCH: 31.5 pg (ref 26.0–34.0)
MCHC: 34.2 g/dL (ref 30.0–36.0)
MCV: 92.1 fL (ref 80.0–100.0)
Platelets: 160 10*3/uL (ref 150–400)
RBC: 3.78 MIL/uL — ABNORMAL LOW (ref 3.87–5.11)
RDW: 13.1 % (ref 11.5–15.5)
WBC: 14.8 10*3/uL — ABNORMAL HIGH (ref 4.0–10.5)
nRBC: 0 % (ref 0.0–0.2)

## 2021-06-29 LAB — TYPE AND SCREEN
ABO/RH(D): A POS
Antibody Screen: NEGATIVE

## 2021-06-29 MED ORDER — OXYTOCIN BOLUS FROM INFUSION: 333.0000 mL | Freq: Once | INTRAVENOUS | Status: DC

## 2021-06-29 MED ORDER — FLEET ENEMA 7-19 GM/118ML RE ENEM: 1.0000 | ENEMA | RECTAL | Status: DC | PRN

## 2021-06-29 MED ORDER — WITCH HAZEL-GLYCERIN EX PADS: 1.0000 | MEDICATED_PAD | CUTANEOUS | Status: DC | PRN

## 2021-06-29 MED ORDER — SOD CITRATE-CITRIC ACID 500-334 MG/5ML PO SOLN: 30.0000 mL | ORAL | Status: DC | PRN

## 2021-06-29 MED ORDER — IBUPROFEN 600 MG PO TABS
600.0000 mg | ORAL_TABLET | Freq: Four times a day (QID) | ORAL | Status: DC | PRN
  Administered 2021-06-29: 600 mg via ORAL
  Filled 2021-06-29: qty 1

## 2021-06-29 MED ORDER — BENZOCAINE-MENTHOL 20-0.5 % EX AERO
1.0000 | INHALATION_SPRAY | CUTANEOUS | Status: DC | PRN
Start: 2021-06-29 — End: 2021-06-30
  Administered 2021-06-29: 1 via TOPICAL
  Filled 2021-06-29: qty 56

## 2021-06-29 MED ORDER — OXYCODONE-ACETAMINOPHEN 5-325 MG PO TABS: 1.0000 | ORAL_TABLET | ORAL | Status: DC | PRN

## 2021-06-29 MED ORDER — ONDANSETRON HCL 4 MG PO TABS: 4.0000 mg | ORAL_TABLET | ORAL | Status: DC | PRN

## 2021-06-29 MED ORDER — LACTATED RINGERS IV SOLN: INTRAVENOUS | Status: DC

## 2021-06-29 MED ORDER — ACETAMINOPHEN 325 MG PO TABS: 650.0000 mg | ORAL_TABLET | ORAL | Status: DC | PRN

## 2021-06-29 MED ORDER — DIBUCAINE (PERIANAL) 1 % EX OINT: 1.0000 | TOPICAL_OINTMENT | CUTANEOUS | Status: DC | PRN

## 2021-06-29 MED ORDER — TRANEXAMIC ACID-NACL 1000-0.7 MG/100ML-% IV SOLN
INTRAVENOUS | Status: AC
  Administered 2021-06-29: 1000 mg via INTRAVENOUS
  Filled 2021-06-29: qty 100

## 2021-06-29 MED ORDER — OXYCODONE HCL 5 MG PO TABS: 10.0000 mg | ORAL_TABLET | ORAL | Status: DC | PRN

## 2021-06-29 MED ORDER — PRENATAL MULTIVITAMIN CH
1.0000 | ORAL_TABLET | Freq: Every day | ORAL | Status: DC
  Administered 2021-06-30: 1 via ORAL
  Filled 2021-06-29: qty 1

## 2021-06-29 MED ORDER — ONDANSETRON HCL 4 MG/2ML IJ SOLN: 4.0000 mg | Freq: Four times a day (QID) | INTRAMUSCULAR | Status: DC | PRN

## 2021-06-29 MED ORDER — SENNOSIDES-DOCUSATE SODIUM 8.6-50 MG PO TABS
2.0000 | ORAL_TABLET | ORAL | Status: DC
  Administered 2021-06-30: 2 via ORAL
  Filled 2021-06-29: qty 2

## 2021-06-29 MED ORDER — COCONUT OIL OIL: 1.0000 | TOPICAL_OIL | Status: DC | PRN

## 2021-06-29 MED ORDER — HYDROXYZINE HCL 50 MG PO TABS: 50.0000 mg | ORAL_TABLET | Freq: Four times a day (QID) | ORAL | Status: DC | PRN

## 2021-06-29 MED ORDER — LIDOCAINE HCL (PF) 1 % IJ SOLN: 30.0000 mL | INTRAMUSCULAR | Status: DC | PRN

## 2021-06-29 MED ORDER — LIDOCAINE HCL (PF) 1 % IJ SOLN
30.0000 mL | INTRAMUSCULAR | Status: AC | PRN
Start: 2021-06-29 — End: 2021-06-29
  Administered 2021-06-29: 30 mL via SUBCUTANEOUS
  Filled 2021-06-29: qty 30

## 2021-06-29 MED ORDER — OXYTOCIN BOLUS FROM INFUSION
333.0000 mL | Freq: Once | INTRAVENOUS | Status: AC
  Administered 2021-06-29: 333 mL via INTRAVENOUS

## 2021-06-29 MED ORDER — OXYCODONE HCL 5 MG PO TABS: 5.0000 mg | ORAL_TABLET | ORAL | Status: DC | PRN

## 2021-06-29 MED ORDER — TETANUS-DIPHTH-ACELL PERTUSSIS 5-2.5-18.5 LF-MCG/0.5 IM SUSY: 0.5000 mL | PREFILLED_SYRINGE | Freq: Once | INTRAMUSCULAR | Status: DC

## 2021-06-29 MED ORDER — OXYTOCIN-SODIUM CHLORIDE 30-0.9 UT/500ML-% IV SOLN
2.5000 [IU]/h | INTRAVENOUS | Status: DC
  Filled 2021-06-29: qty 500

## 2021-06-29 MED ORDER — ONDANSETRON HCL 4 MG/2ML IJ SOLN: 4.0000 mg | INTRAMUSCULAR | Status: DC | PRN

## 2021-06-29 MED ORDER — OXYCODONE-ACETAMINOPHEN 5-325 MG PO TABS: 2.0000 | ORAL_TABLET | ORAL | Status: DC | PRN

## 2021-06-29 MED ORDER — DIPHENHYDRAMINE HCL 25 MG PO CAPS: 25.0000 mg | ORAL_CAPSULE | Freq: Four times a day (QID) | ORAL | Status: DC | PRN

## 2021-06-29 MED ORDER — OXYTOCIN-SODIUM CHLORIDE 30-0.9 UT/500ML-% IV SOLN: 2.5000 [IU]/h | INTRAVENOUS | Status: DC

## 2021-06-29 MED ORDER — SIMETHICONE 80 MG PO CHEW: 80.0000 mg | CHEWABLE_TABLET | ORAL | Status: DC | PRN

## 2021-06-29 MED ORDER — IBUPROFEN 600 MG PO TABS
600.0000 mg | ORAL_TABLET | Freq: Four times a day (QID) | ORAL | Status: DC
  Administered 2021-06-29 – 2021-06-30 (×4): 600 mg via ORAL
  Filled 2021-06-29 (×4): qty 1

## 2021-06-29 MED ORDER — TRANEXAMIC ACID-NACL 1000-0.7 MG/100ML-% IV SOLN: 1000.0000 mg | Freq: Once | INTRAVENOUS | Status: AC

## 2021-06-29 MED ORDER — IBUPROFEN 600 MG PO TABS: 600.0000 mg | ORAL_TABLET | Freq: Four times a day (QID) | ORAL | Status: DC

## 2021-06-29 MED ORDER — LACTATED RINGERS IV SOLN: 500.0000 mL | INTRAVENOUS | Status: DC | PRN

## 2021-06-29 MED ORDER — ZOLPIDEM TARTRATE 5 MG PO TABS: 5.0000 mg | ORAL_TABLET | Freq: Every evening | ORAL | Status: DC | PRN

## 2021-06-29 NOTE — MAU Note (Signed)
Patient arrived to MAU complaining of ctx every 5 mins that started at 4am.   Pt denies vaginal bleeding, leakage of fluid.  + FM reported

## 2021-06-30 LAB — RPR: RPR Ser Ql: NONREACTIVE

## 2021-06-30 LAB — CBC
HCT: 32 % — ABNORMAL LOW (ref 36.0–46.0)
Hemoglobin: 10.7 g/dL — ABNORMAL LOW (ref 12.0–15.0)
MCH: 30.7 pg (ref 26.0–34.0)
MCHC: 33.4 g/dL (ref 30.0–36.0)
MCV: 92 fL (ref 80.0–100.0)
Platelets: 153 10*3/uL (ref 150–400)
RBC: 3.48 MIL/uL — ABNORMAL LOW (ref 3.87–5.11)
RDW: 13.2 % (ref 11.5–15.5)
WBC: 14 10*3/uL — ABNORMAL HIGH (ref 4.0–10.5)
nRBC: 0 % (ref 0.0–0.2)

## 2021-06-30 NOTE — Lactation Note (Signed)
This note was copied from a baby's chart. Lactation Consultation Note  Patient Name: Lori Roach [id number]'B Date: 06/30/2021 Reason for consult: Follow-up assessment;Infant weight loss;Term;Breastfeeding assistance (3.23% WL) Age:35 hours  P3, Term, Infant Female, 3.23% WL  LC entered the room and mom was breastfeeding baby. Mom states that she has no pain and that the latch is comfortable. Baby latched deeply with lips flanged and some swallows were noted. Baby was falling asleep at the breast.   Mom states that she has no questions or concerns and will call for lactation support if needed.    LATCH Score Latch: Grasps breast easily, tongue down, lips flanged, rhythmical sucking.  Audible Swallowing: A few with stimulation (Baby was falling asleep at the breast.)  Type of Nipple: Everted at rest and after stimulation  Comfort (Breast/Nipple): Soft / non-tender  Hold (Positioning): No assistance needed to correctly position infant at breast.  LATCH Score: 9   Lactation Tools Discussed/Used    Interventions Interventions: Education  Discharge    Consult Status Consult Status: Follow-up Date: 07/01/21 Follow-up type: In-patient    Delene Loll 06/30/2021, 4:13 PM

## 2021-07-01 ENCOUNTER — Ambulatory Visit: Payer: Self-pay

## 2021-07-01 ENCOUNTER — Inpatient Hospital Stay (HOSPITAL_COMMUNITY): Admit: 2021-07-01 | Payer: Self-pay

## 2021-07-08 ENCOUNTER — Telehealth (HOSPITAL_COMMUNITY): Payer: Self-pay | Admitting: *Deleted

## 2021-07-08 NOTE — Telephone Encounter (Signed)
Left phone voicemail message.  Duffy Rhody, RN 07-08-2021 at 11:10am

## 2023-01-16 ENCOUNTER — Other Ambulatory Visit: Payer: Self-pay | Admitting: Obstetrics and Gynecology

## 2023-01-16 DIAGNOSIS — R92333 Mammographic heterogeneous density, bilateral breasts: Secondary | ICD-10-CM

## 2023-06-15 ENCOUNTER — Ambulatory Visit
Admission: RE | Admit: 2023-06-15 | Discharge: 2023-06-15 | Disposition: A | Discharge status: Home / Self Care | Payer: BC Managed Care – PPO | Source: Ambulatory Visit | Attending: Obstetrics and Gynecology | Admitting: Obstetrics and Gynecology

## 2023-06-15 DIAGNOSIS — R92333 Mammographic heterogeneous density, bilateral breasts: Secondary | ICD-10-CM

## 2023-06-15 MED ORDER — GADOPICLENOL 0.5 MMOL/ML IV SOLN
6.0000 mL | Freq: Once | INTRAVENOUS | Status: AC | PRN
  Administered 2023-06-15: 6 mL via INTRAVENOUS

## 2023-06-30 ENCOUNTER — Other Ambulatory Visit: Payer: Self-pay | Admitting: Obstetrics and Gynecology

## 2023-06-30 DIAGNOSIS — R928 Other abnormal and inconclusive findings on diagnostic imaging of breast: Secondary | ICD-10-CM

## 2023-12-15 ENCOUNTER — Inpatient Hospital Stay
Admission: RE | Admit: 2023-12-15 | Discharge: 2023-12-15 | Attending: Obstetrics and Gynecology | Admitting: Obstetrics and Gynecology

## 2023-12-15 DIAGNOSIS — R928 Other abnormal and inconclusive findings on diagnostic imaging of breast: Secondary | ICD-10-CM

## 2023-12-15 MED ORDER — GADOPICLENOL 0.5 MMOL/ML IV SOLN
6.0000 mL | Freq: Once | INTRAVENOUS | Status: AC | PRN
  Administered 2023-12-15: 6 mL via INTRAVENOUS
